# Patient Record
Sex: Female | Born: 1980 | Race: Black or African American | Hispanic: No
Health system: Southern US, Community
[De-identification: ages and names within clinical notes are randomized; demographics above are authoritative.]

---

## 2014-03-04 ENCOUNTER — Other Ambulatory Visit (HOSPITAL_COMMUNITY): Payer: Self-pay | Admitting: Obstetrics and Gynecology

## 2014-03-04 DIAGNOSIS — O09522 Supervision of elderly multigravida, second trimester: Secondary | ICD-10-CM

## 2014-03-04 DIAGNOSIS — O289 Unspecified abnormal findings on antenatal screening of mother: Secondary | ICD-10-CM

## 2014-03-04 DIAGNOSIS — O280 Abnormal hematological finding on antenatal screening of mother: Secondary | ICD-10-CM

## 2014-03-07 ENCOUNTER — Ambulatory Visit (HOSPITAL_COMMUNITY): Admission: RE | Admit: 2014-03-07 | Payer: Medicaid Other | Source: Ambulatory Visit

## 2014-03-07 ENCOUNTER — Ambulatory Visit (HOSPITAL_COMMUNITY): Payer: Medicaid Other | Attending: Obstetrics and Gynecology

## 2014-03-19 ENCOUNTER — Other Ambulatory Visit (HOSPITAL_COMMUNITY): Payer: Self-pay

## 2014-03-19 ENCOUNTER — Encounter (HOSPITAL_COMMUNITY): Payer: Self-pay

## 2014-03-19 ENCOUNTER — Other Ambulatory Visit (HOSPITAL_COMMUNITY): Payer: Self-pay | Admitting: Obstetrics and Gynecology

## 2014-03-19 ENCOUNTER — Ambulatory Visit (HOSPITAL_COMMUNITY)
Admission: RE | Admit: 2014-03-19 | Discharge: 2014-03-19 | Disposition: A | Discharge status: Home / Self Care | Payer: Medicaid Other | Source: Ambulatory Visit | Attending: Obstetrics and Gynecology | Admitting: Obstetrics and Gynecology

## 2014-03-19 DIAGNOSIS — O09522 Supervision of elderly multigravida, second trimester: Secondary | ICD-10-CM | POA: Insufficient documentation

## 2014-03-19 DIAGNOSIS — O09529 Supervision of elderly multigravida, unspecified trimester: Secondary | ICD-10-CM | POA: Insufficient documentation

## 2014-03-19 DIAGNOSIS — Z3A16 16 weeks gestation of pregnancy: Secondary | ICD-10-CM | POA: Insufficient documentation

## 2014-03-19 DIAGNOSIS — O280 Abnormal hematological finding on antenatal screening of mother: Secondary | ICD-10-CM | POA: Insufficient documentation

## 2014-03-19 DIAGNOSIS — Z3689 Encounter for other specified antenatal screening: Secondary | ICD-10-CM | POA: Insufficient documentation

## 2014-03-19 DIAGNOSIS — Z36 Encounter for antenatal screening of mother: Secondary | ICD-10-CM | POA: Insufficient documentation

## 2014-03-19 DIAGNOSIS — Z315 Encounter for genetic counseling: Secondary | ICD-10-CM | POA: Diagnosis not present

## 2014-03-19 NOTE — Progress Notes (Signed)
DOB: [birth year] Referring Provider: Lennox SoldersKulish, Christine E, DO Appointment Date: 03/19/2014 Attending: Dr. Particia NearingMartha Decker  [name] and her husband, [name], were seen for genetic counseling regarding a maternal age of 33 y.o. and an increased risk for Down syndrome based on a First trimester screen.  They were counseled regarding maternal age and the association with risk for chromosome conditions due to nondisjunction.   We reviewed chromosomes, nondisjunction, and the associated 1 in 125 risk for fetal aneuploidy related to a maternal age of 33 y.o. at 7130w5d gestation.  They were counseled that the risk for aneuploidy decreases as gestational age increases, accounting for those pregnancies which spontaneously abort.  We specifically discussed Down syndrome (trisomy 6321), trisomies 7213 and 2518, and sex chromosome aneuploidies (47,XXX and 47,XXY) including the common features and prognoses of each.   We also reviewed [name] First screen result and the associated increase in risk for fetal Down syndrome (1 in 214 to 1 in 24).  They were counseled regarding other explanations for a screen positive result including normal variation and differences in maternal metabolism.  In addition, we reviewed the screen adjusted reduction in risks for trisomy 7418 .  They understand that First screen provides a pregnancy specific risk for Down syndrome, but is not considered to be diagnostic.  [name] had an ultrasound today, that report will be sent under separate cover.  However, we discussed that 50-80% of fetuses with Down syndrome and up to 90-95% of fetuses with trisomy 18/13, when well visualized, have detectable anomalies or soft markers by detailed ultrasound.   As the ultrasound today did not identify any fetal anomalies or soft markers of aneuploidy, their 1 in 24 chance for Down syndrome can be reduced to less than 1 in 48.   We reviewed the additional option of noninvasive prenatal  screening (NIPS).  Specifically, we discussed that NIPS analyzes cell free fetal DNA found in the maternal circulation. This test is not diagnostic for chromosome conditions, but can provide information regarding the presence or absence of extra fetal DNA for chromosomes 13, 18, 21, X, and Y, and missing fetal DNA for chromosome X and Y (Turner syndrome). Thus, it would not identify or rule out all genetic conditions. The reported detection rate is greater than 99% for Trisomy 21 and the false positive rate is reported to be less than 0.1%.   This couple was also counseled regarding diagnostic testing via amniocentesis.  We reviewed the approximate 1 in 300-500 risk for complications, including spontaneous pregnancy loss. After consideration of all the options, they declined additional screening or diagnostic testing at this time.  They have my number to call should additional questions or concerns arise.  [name] was provided with written information regarding sickle cell anemia (SCA) including the carrier frequency and incidence in the African-American population, the availability of carrier testing and prenatal diagnosis if indicated.  In addition, we discussed that hemoglobinopathies are routinely screened for as part of the [place] newborn screening panel.  She believes this testing was previously performed.   Both family histories were reviewed and found to be noncontributory for birth defects, intellectual disability, and known genetic conditions. Without further information regarding the provided family history, an accurate genetic risk cannot be calculated. Further genetic counseling is warranted if more information is obtained.  [name] denied exposure to environmental toxins or chemical agents. She denied the use of alcohol, tobacco or street drugs. She denied significant viral illnesses during  the course of her pregnancy. Her medical and surgical histories were noncontributory.     I counseled  this couple regarding the above risks and available options.  The approximate face-to-face time with the genetic counselor was 40 minutes.    Morgan Gemmaaragh Dayzee Trower, MS,  Certified Genetic Counselor

## 2015-01-22 ENCOUNTER — Encounter (HOSPITAL_COMMUNITY): Payer: Self-pay | Admitting: *Deleted

## 2015-10-19 IMAGING — US US OB DETAIL+14 WK
1 series · 12 of 28 positions shown · non-contrast
Comparison: none

[Series 1: us ob detail+14 wk · 0.22mm/px · 12 of 90 slices shown]
[im 4/90]
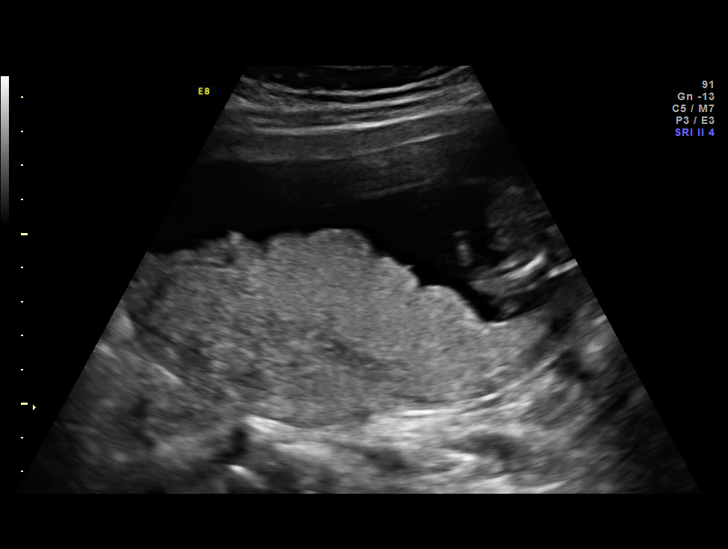
[im 10/90]
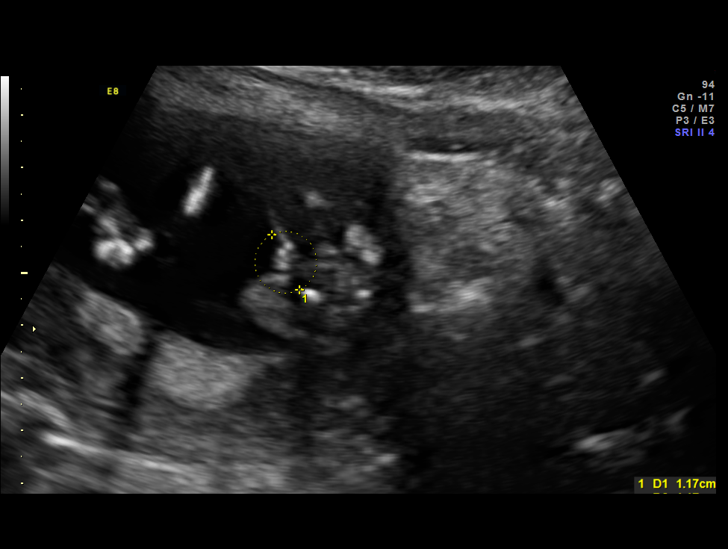
[im 17/90]
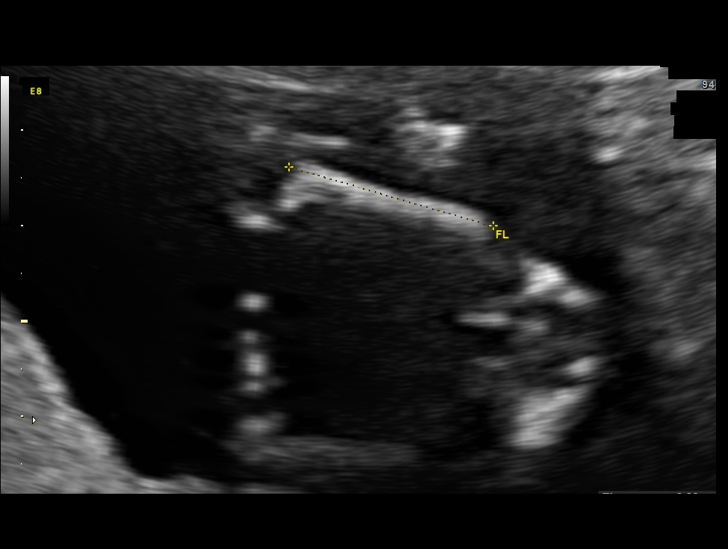
[im 27/90]
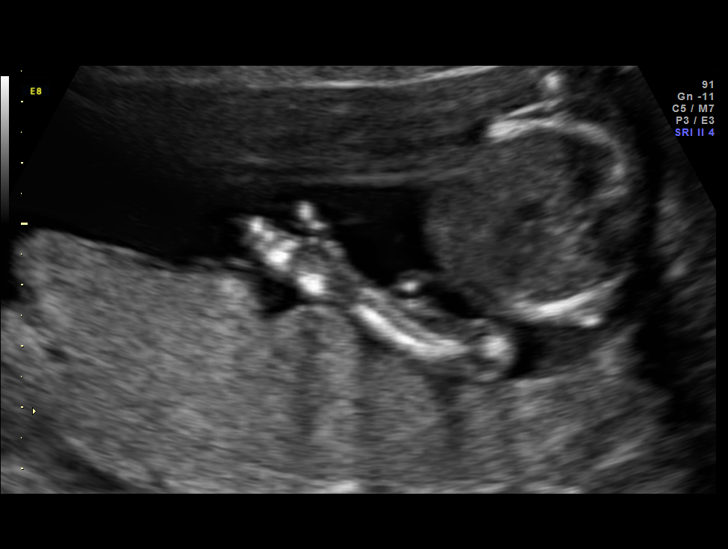
[im 33/90]
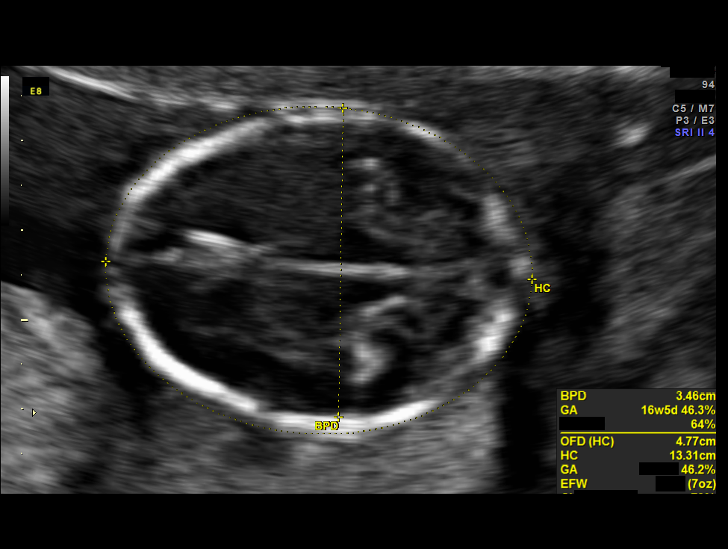
[im 40/90]
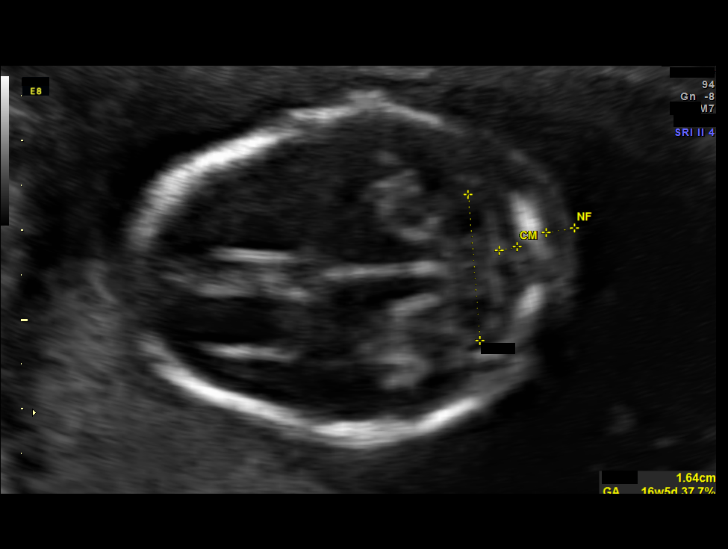
[im 50/90]
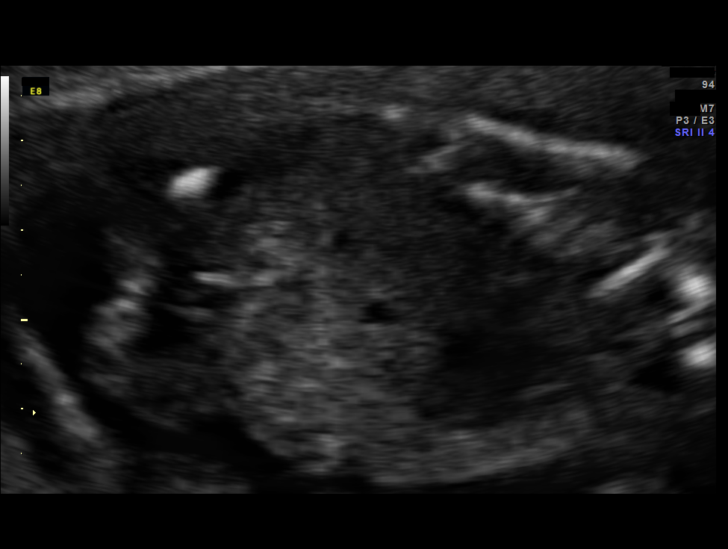
[im 57/90]
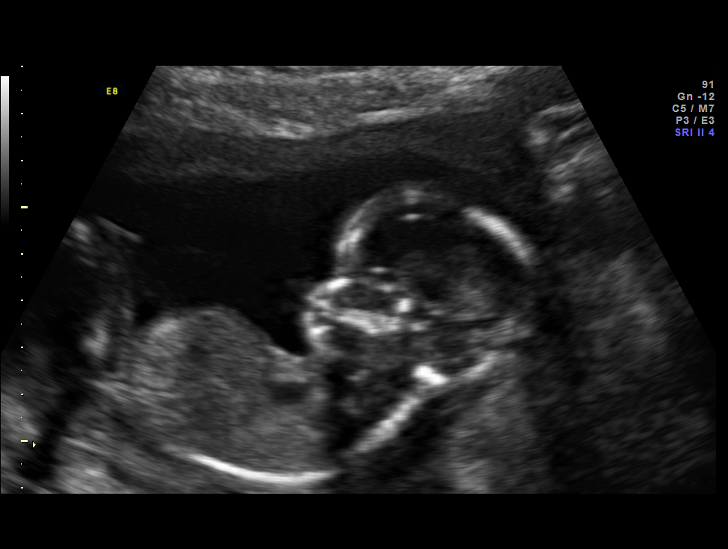
[im 63/90]
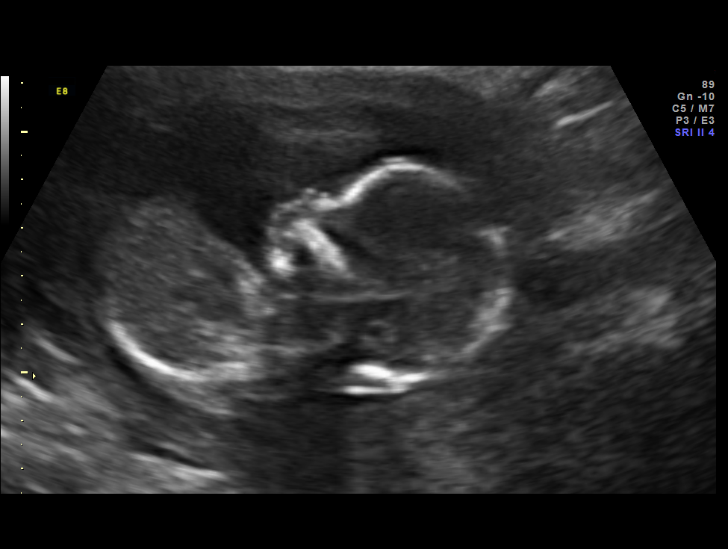
[im 73/90]
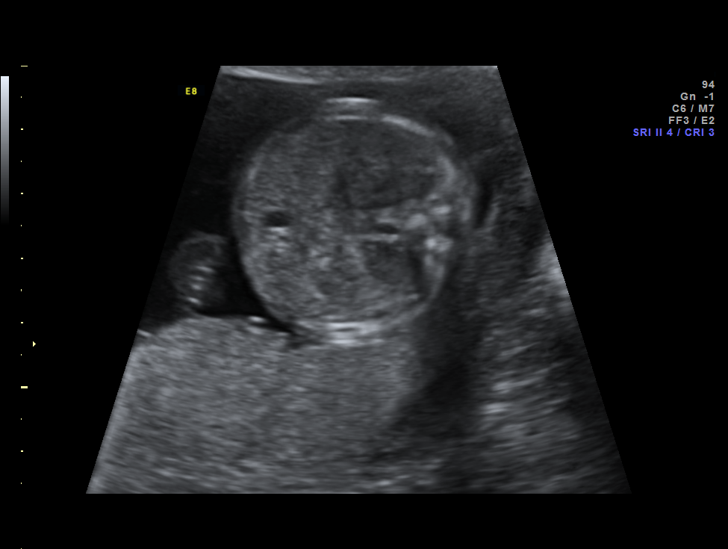
[im 80/90]
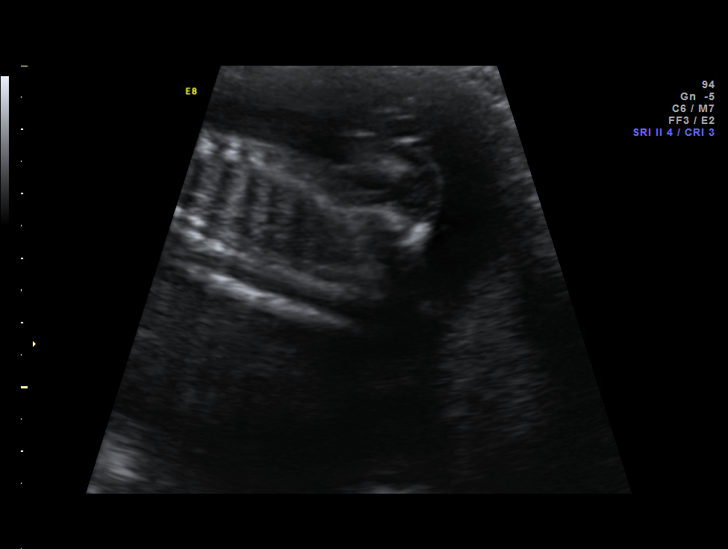
[im 86/90]
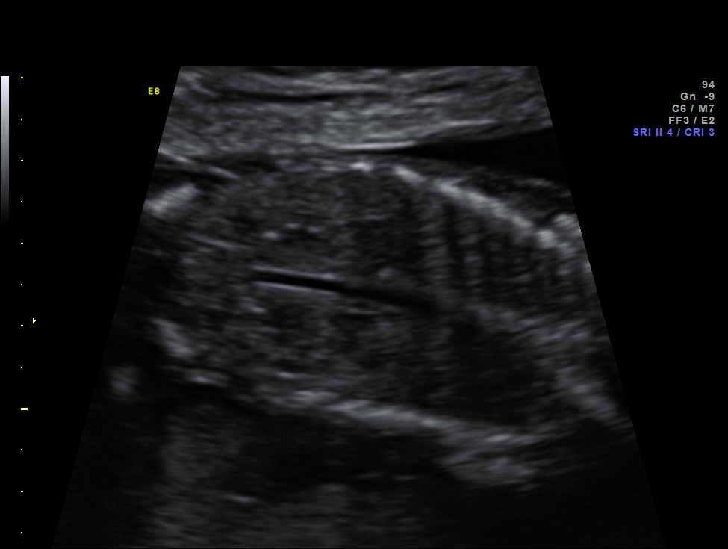

[12 of 28 positions shown; findings below may reference images not displayed]

OBSTETRICS REPORT
                      (Signed Final 03/19/2014 [DATE])

Service(s) Provided

 US OB DETAIL + 14 WK                                  76811.0
Indications

 Detailed fetal anatomic survey                        Z36
 Abnormal first trimester screen (NT) [DATE] DS risk
 Advanced maternal age multigravida 33+, second
 trimester
 16 weeks gestation of pregnancy
Fetal Evaluation

 Num Of Fetuses:    1
 Fetal Heart Rate:  145                          bpm
 Cardiac Activity:  Observed
 Presentation:      Variable
 Placenta:          Posterior Marginal
                    Previa
 P. Cord            Visualized
 Insertion:

 Amniotic Fluid
 AFI FV:      Subjectively within normal limits
Biometry

 BPD:     34.9   mm    G. Age:  16w 5d                CI:          74.3   70 - 86
 OFD:       47   mm                                   FL/HC:       17.0   14.6 -

 HC:     131.8   mm    G. Age:  16w 5d       42   %   HC/AC:       1.09   1.07 -

 AC:     120.4   mm    G. Age:  17w 5d       81   %   FL/BPD:
 FL:      22.4   mm    G. Age:  16w 5d       46   %   FL/AC:       18.6   20 - 24
 HUM:     22.3   mm    G. Age:  16w 6d       58   %
 CER:     16.4   mm    G. Age:  16w 3d       43   %
 NFT:       3.2  mm

 Est. FW:     184   gm     0 lb 6 oz     69  %
Gestational Age

 LMP:           16w 5d        Date:  11/22/13                 EDD:    08/29/14
 U/S Today:     17w 0d                                        EDD:    08/27/14
 Best:          16w 5d     Det. By:  LMP  (11/22/13)          EDD:    08/29/14
Anatomy

 Cranium:          Appears normal         Aortic Arch:       Appears normal
 Fetal Cavum:      Appears normal         Ductal Arch:       Appears normal
 Ventricles:       Appears normal         Diaphragm:         Appears normal
 Choroid Plexus:   Appears normal         Stomach:           Appears normal, left
                                                             sided
 Cerebellum:       Appears normal         Abdomen:           Appears normal
 Posterior Fossa:  Appears normal         Abdominal Wall:    Appears nml (cord
                                                             insert, abd wall)
 Nuchal Fold:      Appears normal         Cord Vessels:      Appears normal (3
                                                             vessel cord)
 Face:             Appears normal         Kidneys:           Appear normal
                   (orbits and profile)
 Lips:             Appears normal         Bladder:           Appears normal
 Heart:            Appears normal         Spine:             Appears normal
                   (4CH, axis, and
                   situs)
 RVOT:             Appears normal         Lower              Appears normal
                                          Extremities:
 LVOT:             Appears normal         Upper              Appears normal
                                          Extremities:

 Other:  Heels and 5th digit appear normal. Fetus appears to be a female.
Targeted Anatomy

 Fetal Central Nervous System
 Cisterna Magna:
Cervix Uterus Adnexa

 Cervical Length:    3.4       cm

 Cervix:       Normal appearance by transabdominal scan. Appears
               closed, without funnelling.
 Left Ovary:    Within normal limits.
 Right Ovary:   Not visualized. No adnexal mass visualized.
Impression

 SIUP at 16+5 weeks
 Normal detailed fetal anatomy
 Markers of aneuploidy: none
 Normal amniotic fluid volume
 Measurements consistent with LMP dating
 Marginal placenta previa

 The US findings were shared with Ms. Rajaram.  The
 implications of a normal detailed US on her T21 risk were
 discussed in detail. All of her prenatal testing options were
 reviewed. After careful consideration, she declined
 amniocentesis and cf DNA screening.
Recommendations

 Follow-up ultrasound in early third trimester weeks to
 reassess placental location
 See genetic counseling note
 questions or concerns.

## 2022-03-31 ENCOUNTER — Other Ambulatory Visit: Payer: Self-pay | Admitting: Physician Assistant

## 2022-03-31 DIAGNOSIS — Z1231 Encounter for screening mammogram for malignant neoplasm of breast: Secondary | ICD-10-CM

## 2024-02-22 ENCOUNTER — Other Ambulatory Visit: Payer: Self-pay | Admitting: Physician Assistant

## 2024-02-22 DIAGNOSIS — Z1231 Encounter for screening mammogram for malignant neoplasm of breast: Secondary | ICD-10-CM

## 2024-02-28 ENCOUNTER — Inpatient Hospital Stay
Admission: RE | Admit: 2024-02-28 | Discharge: 2024-02-28 | Attending: Physician Assistant | Admitting: Physician Assistant

## 2024-02-28 DIAGNOSIS — Z1231 Encounter for screening mammogram for malignant neoplasm of breast: Secondary | ICD-10-CM
